# Patient Record
Sex: Female | Born: 1980 | Race: Black or African American | Hispanic: No | Marital: Married | Smoking: Never smoker
Health system: Southern US, Community
[De-identification: ages and names within clinical notes are randomized; demographics above are authoritative.]

## PROBLEM LIST (undated history)

## (undated) DIAGNOSIS — E079 Disorder of thyroid, unspecified: Secondary | ICD-10-CM

---

## 2020-06-27 ENCOUNTER — Emergency Department (HOSPITAL_COMMUNITY)
Admission: EM | Admit: 2020-06-27 | Discharge: 2020-06-28 | Disposition: A | Discharge status: Home / Self Care | Payer: BC Managed Care – PPO | Attending: Emergency Medicine | Admitting: Emergency Medicine

## 2020-06-27 DIAGNOSIS — Z5321 Procedure and treatment not carried out due to patient leaving prior to being seen by health care provider: Secondary | ICD-10-CM | POA: Insufficient documentation

## 2020-06-27 DIAGNOSIS — R109 Unspecified abdominal pain: Secondary | ICD-10-CM | POA: Insufficient documentation

## 2020-06-27 HISTORY — DX: Disorder of thyroid, unspecified: E07.9

## 2020-06-27 LAB — I-STAT BETA HCG BLOOD, ED (MC, WL, AP ONLY): I-stat hCG, quantitative: <5 m[IU]/mL (ref <5)

## 2020-06-27 MED ORDER — FAMOTIDINE IN NACL 20-0.9 MG/50ML-% IV SOLN
20.0000 mg | INTRAVENOUS | Status: DC
  Filled 2020-06-27: qty 50

## 2020-06-27 MED ORDER — ALUM & MAG HYDROXIDE-SIMETH 200-200-20 MG/5ML PO SUSP
30.0000 mL | Freq: Once | ORAL | Status: AC
  Administered 2020-06-28: 30 mL via ORAL
  Filled 2020-06-27: qty 30

## 2020-06-27 NOTE — ED Provider Notes (Signed)
MSE was initiated and I personally evaluated the patient and placed orders (if any) at  11:28 PM on June 27, 2020.  Patient here with epigastric abdominal pain started yesterday.  She reports significant epigastric pressure.  Has tried taking Gas-X milk of magnesia with mild relief.  Alert and oriented Nondistended abdomen Significant epigastric abdominal Lung sounds are clear  Discussed with patient that their care has been initiated.   They are counseled that they will need to remain in the ED until the completion of their workup, including full H&P and results of any tests.  Risks of leaving the emergency department prior to completion of treatment were discussed. Patient was advised to inform ED staff if they are leaving before their treatment is complete. The patient acknowledged these risks and time was allowed for questions.    The patient appears stable so that the remainder of the MSE may be completed by another provider.    Roxy Horseman, PA-C 06/27/20 [id number]    Palumbo, April, MD 06/28/20 [phone]

## 2020-06-28 ENCOUNTER — Emergency Department (HOSPITAL_COMMUNITY): Payer: BC Managed Care – PPO

## 2020-06-28 ENCOUNTER — Encounter (HOSPITAL_COMMUNITY): Payer: Self-pay | Admitting: *Deleted

## 2020-06-28 ENCOUNTER — Other Ambulatory Visit: Payer: Self-pay

## 2020-06-28 LAB — COMPREHENSIVE METABOLIC PANEL
ALT: 20 U/L (ref 0–44)
AST: 23 U/L (ref 15–41)
Albumin: 4 g/dL (ref 3.5–5.0)
Alkaline Phosphatase: 51 U/L (ref 38–126)
Anion gap: 7 (ref 5–15)
BUN: 10 mg/dL (ref 6–20)
CO2: 26 mmol/L (ref 22–32)
Calcium: 9.5 mg/dL (ref 8.9–10.3)
Chloride: 102 mmol/L (ref 98–111)
Creatinine, Ser: 0.91 mg/dL (ref 0.44–1.00)
GFR, Estimated: >60 mL/min (ref >60)
Glucose, Bld: 113 mg/dL — ABNORMAL HIGH (ref 70–99)
Potassium: 4.1 mmol/L (ref 3.5–5.1)
Sodium: 135 mmol/L (ref 135–145)
Total Bilirubin: 0.9 mg/dL (ref 0.3–1.2)
Total Protein: 7.3 g/dL (ref 6.5–8.1)

## 2020-06-28 LAB — CBC WITH DIFFERENTIAL/PLATELET
Abs Immature Granulocytes: 0.01 10*3/uL (ref 0.00–0.07)
Basophils Absolute: 0 10*3/uL (ref 0.0–0.1)
Basophils Relative: 0 %
Eosinophils Absolute: 0.1 10*3/uL (ref 0.0–0.5)
Eosinophils Relative: 1 %
HCT: 39.7 % (ref 36.0–46.0)
Hemoglobin: 12.9 g/dL (ref 12.0–15.0)
Immature Granulocytes: 0 %
Lymphocytes Relative: 36 %
Lymphs Abs: 1.8 10*3/uL (ref 0.7–4.0)
MCH: 31.2 pg (ref 26.0–34.0)
MCHC: 32.5 g/dL (ref 30.0–36.0)
MCV: 95.9 fL (ref 80.0–100.0)
Monocytes Absolute: 0.4 10*3/uL (ref 0.1–1.0)
Monocytes Relative: 8 %
Neutro Abs: 2.8 10*3/uL (ref 1.7–7.7)
Neutrophils Relative %: 55 %
Platelets: 220 10*3/uL (ref 150–400)
RBC: 4.14 MIL/uL (ref 3.87–5.11)
RDW: 12.2 % (ref 11.5–15.5)
WBC: 5.1 10*3/uL (ref 4.0–10.5)
nRBC: 0 % (ref 0.0–0.2)

## 2020-06-28 LAB — LIPASE, BLOOD: Lipase: 36 U/L (ref 11–51)

## 2020-06-28 NOTE — ED Notes (Signed)
The pts pain has completely gone away after the maalox

## 2020-06-28 NOTE — ED Triage Notes (Signed)
The pt is c/o abd pain since yesterday   She took some med that made it better until she took  Another meds and then the pain increased  Pt continues with pain   lmp last saturday

## 2020-06-28 NOTE — ED Notes (Signed)
Pt left due to not being seen quick enough 

## 2022-11-23 IMAGING — DX DG ABDOMEN ACUTE W/ 1V CHEST
3 series · 3 of 3 positions shown · non-contrast
Comparison: None.

CLINICAL DATA: Epigastric pain starting yesterday.

EXAM:
DG ABDOMEN ACUTE WITH 1 VIEW CHEST

[chest pa]
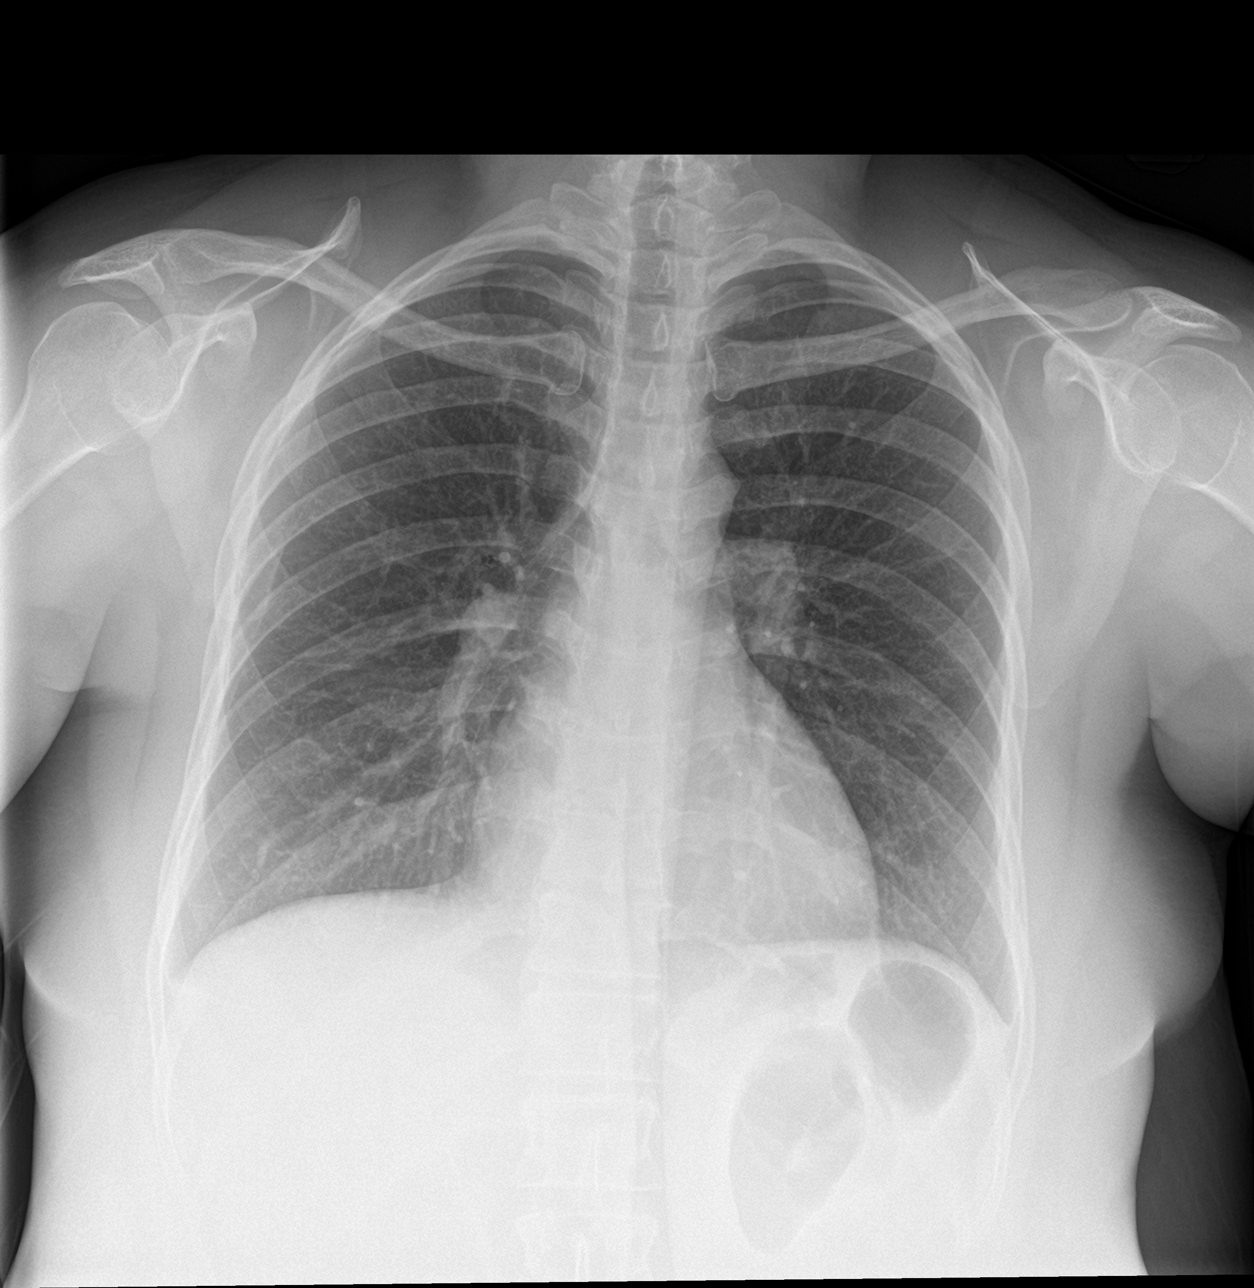

[abdomen erect]
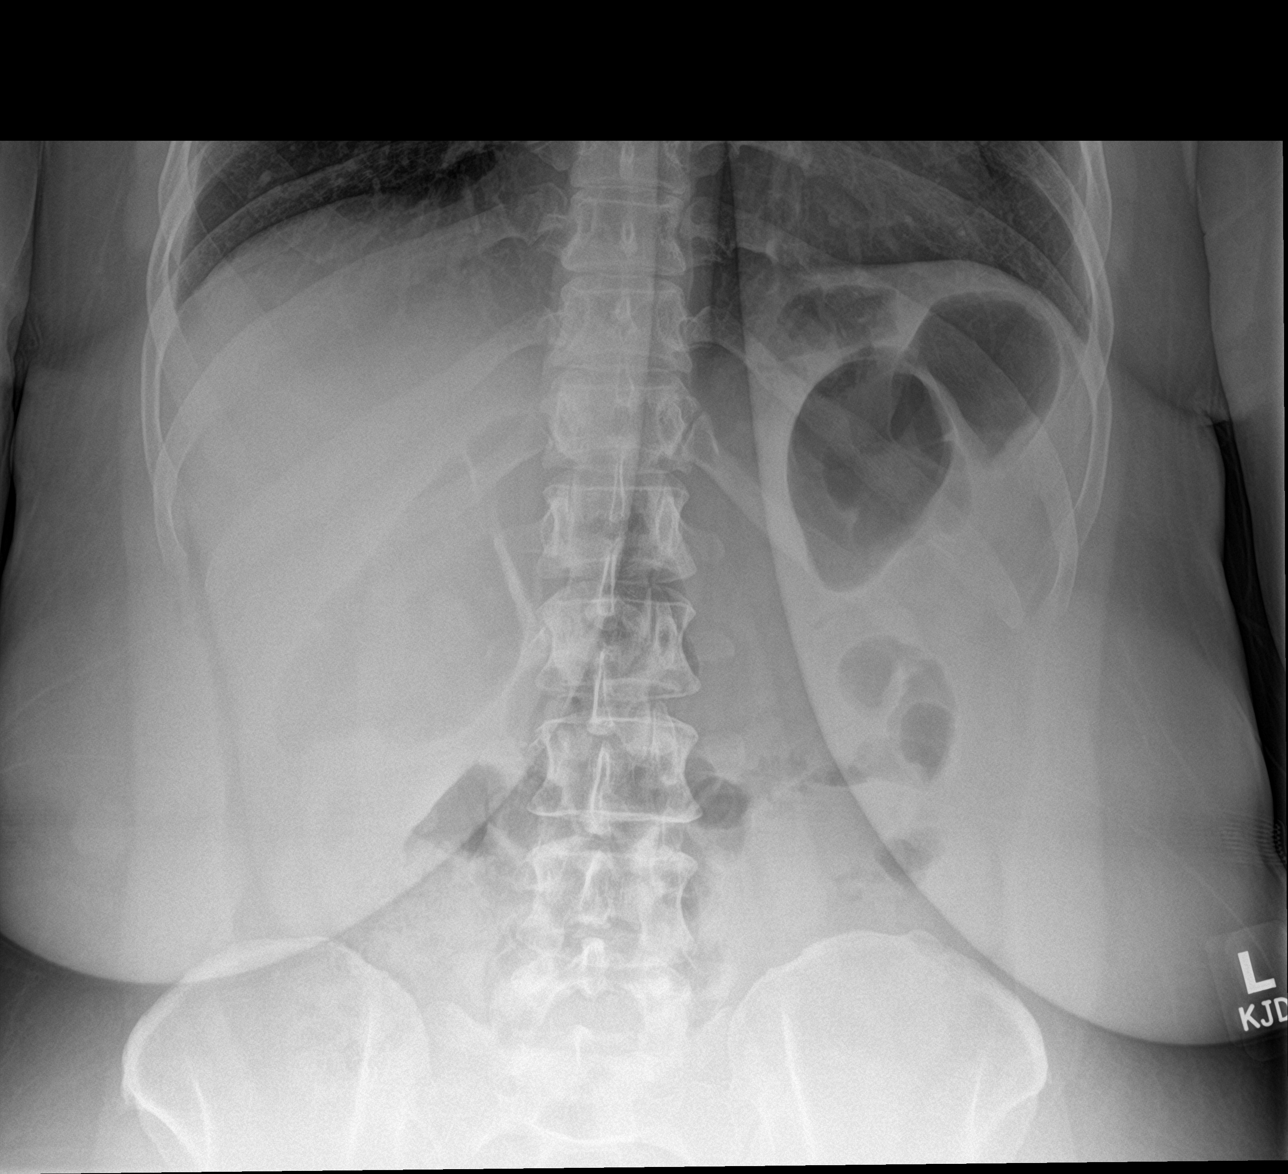

[abdomen supine]
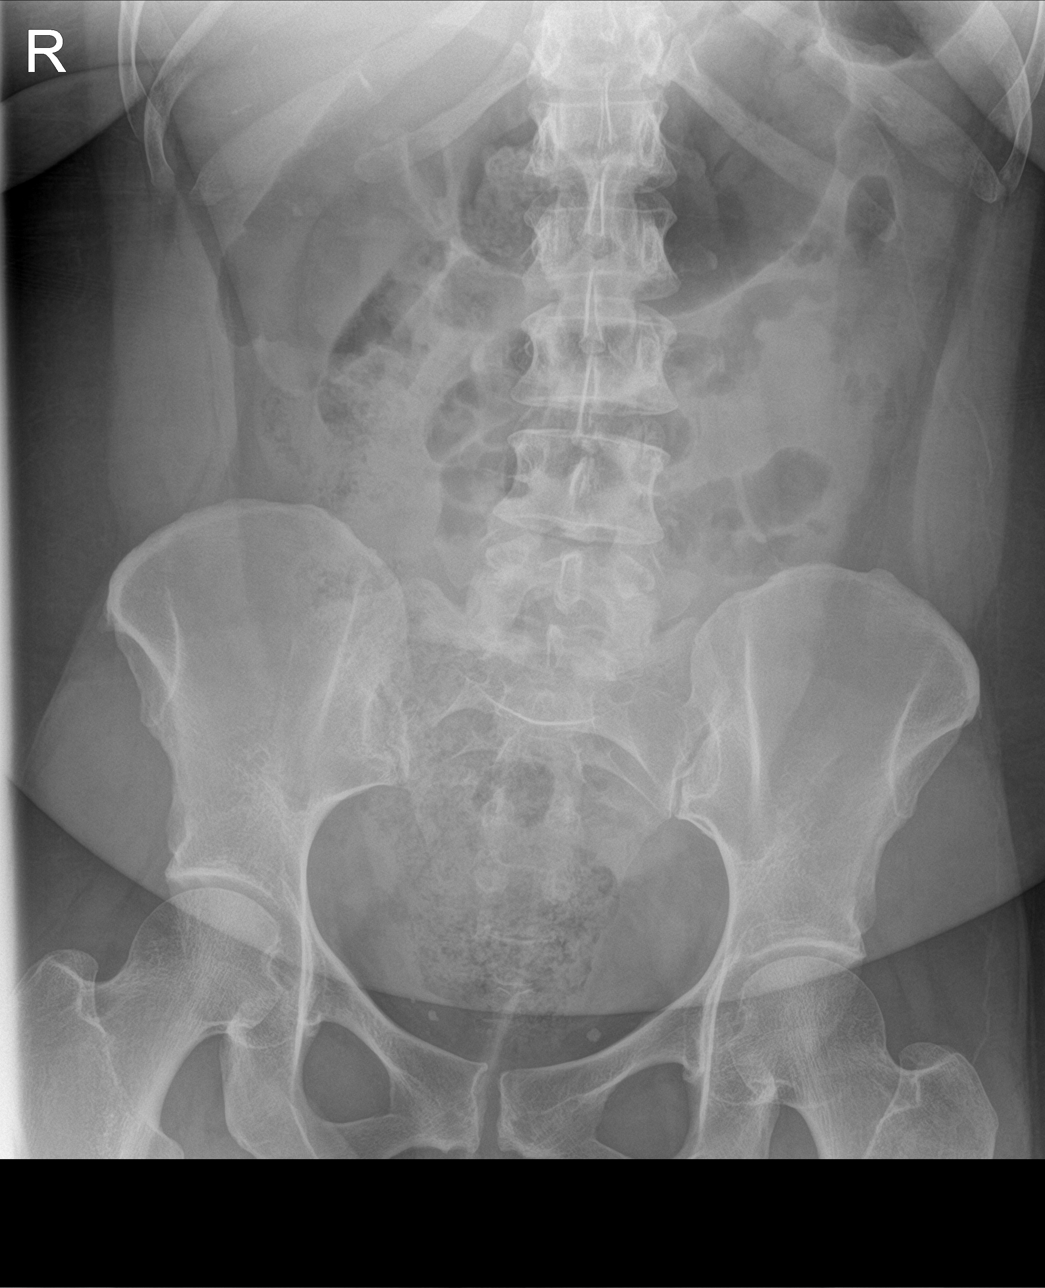

[3 of 3 positions shown; findings below may reference images not displayed]

FINDINGS: Normal heart size and pulmonary vascularity. No focal airspace
disease or consolidation in the lungs. No blunting of costophrenic
angles. No pneumothorax. Mediastinal contours appear intact.

Scattered gas and stool in the colon. No small or large bowel
distention. No free intra-abdominal air. No abnormal air-fluid
levels. No radiopaque stones. Visualized bones appear intact.
IMPRESSION: Negative abdominal radiographs.  No acute cardiopulmonary disease.
